# Patient Record
Sex: Male | Born: 1995 | Race: Black or African American | Hispanic: No | Smoking: Never smoker
Health system: Southern US, Community
[De-identification: ages and names within clinical notes are randomized; demographics above are authoritative.]

---
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
## 2017-09-01 LAB — GLUCOSE, POCT (MANUAL RESULT ENTRY): POC Glucose: 106 mg/dl — AB (ref 70–99)

## 2018-07-01 ENCOUNTER — Other Ambulatory Visit: Payer: Self-pay

## 2018-07-01 ENCOUNTER — Emergency Department (HOSPITAL_COMMUNITY)
Admission: EM | Admit: 2018-07-01 | Discharge: 2018-07-01 | Disposition: A | Discharge status: Home / Self Care | Payer: Self-pay | Attending: Emergency Medicine | Admitting: Emergency Medicine

## 2018-07-01 ENCOUNTER — Encounter (HOSPITAL_COMMUNITY): Payer: Self-pay | Admitting: *Deleted

## 2018-07-01 ENCOUNTER — Emergency Department (HOSPITAL_COMMUNITY): Payer: Self-pay

## 2018-07-01 DIAGNOSIS — J069 Acute upper respiratory infection, unspecified: Secondary | ICD-10-CM | POA: Insufficient documentation

## 2018-07-01 DIAGNOSIS — B9789 Other viral agents as the cause of diseases classified elsewhere: Secondary | ICD-10-CM

## 2018-07-01 NOTE — Discharge Instructions (Signed)
Your symptoms are likely caused by a viral upper respiratory infection. Antibiotics are not helpful in treating viral infection, the virus should run its course in about 5-7 days. Please make sure you are drinking plenty of fluids. You can treat your symptoms supportively with tylenol/ibuprofen for fevers and pains, Zyrtec and Flonase to help with nasal congestion, and over the counter cough syrups and throat lozenges to help with cough. If your symptoms are not improving please follow up with you Primary doctor.   If you develop persistent fevers, shortness of breath or difficulty breathing, chest pain, severe headache and neck pain, persistent nausea and vomiting or other new or concerning symptoms return to the Emergency department.  

## 2018-07-01 NOTE — ED Notes (Signed)
Patient transported to X-ray 

## 2018-07-01 NOTE — ED Provider Notes (Signed)
[facility] EMERGENCY DEPARTMENT Provider Note   CSN: [id number] Arrival date & time: 07/01/18  1748     History   Chief Complaint Chief Complaint  Patient presents with  . Cough  . URI    HPI Grant Blair is a 23 y.o. male.  Grant Blair is a 23 y.o. male who is otherwise healthy, presents to the emergency department for 4 days of fevers, cough, body aches, sore throat, rhinorrhea and nasal congestion.  Symptoms were gradual in onset and have been constant and worsening since then.  He is taken some over-the-counter Mucinex and NyQuil with mild improvement has not tried anything else to treat his symptoms.  He does report cough is intermittently productive and has had some fevers, T-max at home of 102 which she is treated with Tylenol.  He denies any chest pain or shortness of breath, no difficulty breathing.  No history of reactive airway disease or smoking.  He denies any associated nausea vomiting or abdominal pain, unsure of any specific sick contacts.  No other aggravating or alleviating factors.     History reviewed. No pertinent past medical history.  There are no active problems to display for this patient.   History reviewed. No pertinent surgical history.      Home Medications    Prior to Admission medications   Medication Sig Start Date End Date Taking? Authorizing Provider  benzonatate (TESSALON) 100 MG capsule Take 2 capsules (200 mg total) by mouth 2 (two) times daily as needed for cough. 01/14/15   Barrett Henle, PA-C    Family History History reviewed. No pertinent family history.  Social History Social History   Tobacco Use  . Smoking status: Never Smoker  Substance Use Topics  . Alcohol use: Never    Frequency: Never  . Drug use: Not on file     Allergies   Patient has no known allergies.   Review of Systems Review of Systems  Constitutional: Positive for chills and fever.  HENT: Positive for  congestion, postnasal drip, rhinorrhea and sore throat. Negative for ear discharge, ear pain and sinus pain.   Eyes: Negative for visual disturbance.  Respiratory: Positive for cough. Negative for chest tightness, shortness of breath and wheezing.   Cardiovascular: Negative for chest pain.  Gastrointestinal: Negative for abdominal pain, nausea and vomiting.  Genitourinary: Negative for dysuria and frequency.  Musculoskeletal: Negative for arthralgias and myalgias.  Skin: Negative for color change and rash.  Neurological: Negative for dizziness, syncope and headaches.     Physical Exam Updated Vital Signs BP 117/76 (BP Location: Right Arm)   Pulse 99   Temp 98.9 F (37.2 C) (Oral)   Resp 18   SpO2 98%   Physical Exam Vitals signs and nursing note reviewed.  Constitutional:      General: He is not in acute distress.    Appearance: Normal appearance. He is well-developed and normal weight. He is not ill-appearing or diaphoretic.  HENT:     Head: Normocephalic and atraumatic.     Right Ear: Tympanic membrane and ear canal normal.     Left Ear: Tympanic membrane and ear canal normal.     Nose: Congestion and rhinorrhea present.     Comments: Bilateral nares patent with moderate mucosal edema and clear rhinorrhea present.     Mouth/Throat:     Mouth: Mucous membranes are moist.     Pharynx: Oropharynx is clear. Posterior oropharyngeal erythema present.  Comments: Posterior oropharynx clear and mucous membranes moist, there is mild erythema but no edema or tonsillar exudates, uvula midline, normal phonation, no trismus, tolerating secretions without difficulty. Eyes:     General:        Right eye: No discharge.        Left eye: No discharge.  Neck:     Musculoskeletal: Neck supple.     Comments: No rigidity Cardiovascular:     Rate and Rhythm: Normal rate and regular rhythm.     Heart sounds: Normal heart sounds. No murmur. No friction rub. No gallop.   Pulmonary:      Effort: Pulmonary effort is normal. No respiratory distress.     Breath sounds: Normal breath sounds.     Comments: Respirations equal and unlabored, patient able to speak in full sentences, lungs clear to auscultation bilaterally Abdominal:     General: Bowel sounds are normal. There is no distension.     Palpations: Abdomen is soft. There is no mass.     Tenderness: There is no abdominal tenderness. There is no guarding.     Comments: Abdomen soft, nondistended, nontender to palpation in all quadrants without guarding or peritoneal signs  Musculoskeletal:        General: No deformity.  Lymphadenopathy:     Cervical: No cervical adenopathy.  Skin:    General: Skin is warm and dry.     Capillary Refill: Capillary refill takes less than 2 seconds.  Neurological:     Mental Status: He is alert and oriented to person, place, and time.  Psychiatric:        Mood and Affect: Mood normal.        Behavior: Behavior normal.      ED Treatments / Results  Labs (all labs ordered are listed, but only abnormal results are displayed) Labs Reviewed - No data to display  EKG None  Radiology Dg Chest 2 View  Result Date: 07/01/2018 CLINICAL DATA:  Cough, body aches, sore throat, and fever for 4 days. EXAM: CHEST - 2 VIEW COMPARISON:  01/14/2015 FINDINGS: The heart size and mediastinal contours are within normal limits. Both lungs are clear. The visualized skeletal structures are unremarkable. IMPRESSION: No active cardiopulmonary disease. Electronically Signed   By: Burman Nieves M.D.   On: 07/01/2018 20:54    Procedures Procedures (including critical care time)  Medications Ordered in ED Medications - No data to display   Initial Impression / Assessment and Plan / ED Course  I have reviewed the triage vital signs and the nursing notes.  Pertinent labs & imaging results that were available during my care of the patient were reviewed by me and considered in my medical decision making  (see chart for details).  Pt presents with nasal congestion and cough. Pt is well appearing and vitals are normal. Lungs CTA on exam. Pt CXR negative for acute infiltrate. Patients symptoms are consistent with URI, likely viral etiology. Discussed that antibiotics are not indicated for viral infections. Pt will be discharged with symptomatic treatment.  Verbalizes understanding and is agreeable with plan. Pt is hemodynamically stable & in NAD prior to dc. Return precautions discussed, pt expresses understanding and agrees with plan.   Final Clinical Impressions(s) / ED Diagnoses   Final diagnoses:  Viral URI with cough    ED Discharge Orders    None       Legrand Rams 07/01/18 2113    Maia Plan, MD 07/02/18 1334

## 2018-07-01 NOTE — ED Triage Notes (Signed)
Pt reports cough, bodyaches, sore throat, fever x 3-4 days.

## 2018-10-09 ENCOUNTER — Emergency Department (HOSPITAL_COMMUNITY)
Admission: EM | Admit: 2018-10-09 | Discharge: 2018-10-09 | Disposition: A | Discharge status: Home / Self Care | Payer: Medicaid Other | Attending: Emergency Medicine | Admitting: Emergency Medicine

## 2018-10-09 ENCOUNTER — Other Ambulatory Visit: Payer: Self-pay

## 2018-10-09 ENCOUNTER — Encounter (HOSPITAL_COMMUNITY): Payer: Self-pay

## 2018-10-09 DIAGNOSIS — Y999 Unspecified external cause status: Secondary | ICD-10-CM | POA: Diagnosis not present

## 2018-10-09 DIAGNOSIS — S39012A Strain of muscle, fascia and tendon of lower back, initial encounter: Secondary | ICD-10-CM | POA: Diagnosis not present

## 2018-10-09 DIAGNOSIS — Y939 Activity, unspecified: Secondary | ICD-10-CM | POA: Diagnosis not present

## 2018-10-09 DIAGNOSIS — Y92099 Unspecified place in other non-institutional residence as the place of occurrence of the external cause: Secondary | ICD-10-CM | POA: Diagnosis not present

## 2018-10-09 DIAGNOSIS — S3982XA Other specified injuries of lower back, initial encounter: Secondary | ICD-10-CM | POA: Diagnosis present

## 2018-10-09 DIAGNOSIS — X500XXA Overexertion from strenuous movement or load, initial encounter: Secondary | ICD-10-CM | POA: Diagnosis not present

## 2018-10-09 DIAGNOSIS — Z79899 Other long term (current) drug therapy: Secondary | ICD-10-CM | POA: Insufficient documentation

## 2018-10-09 MED ORDER — KETOROLAC TROMETHAMINE 30 MG/ML IJ SOLN
30.0000 mg | Freq: Once | INTRAMUSCULAR | Status: AC
  Administered 2018-10-09: 30 mg via INTRAMUSCULAR
  Filled 2018-10-09: qty 1

## 2018-10-09 MED ORDER — METHOCARBAMOL 500 MG PO TABS: 500.0000 mg | ORAL_TABLET | Freq: Two times a day (BID) | ORAL | 0 refills | Status: AC

## 2018-10-09 MED ORDER — NAPROXEN 500 MG PO TABS: 500.0000 mg | ORAL_TABLET | Freq: Two times a day (BID) | ORAL | 0 refills | Status: AC

## 2018-10-09 NOTE — Discharge Instructions (Addendum)
Take the medications to help with your pain. Applying heat, stretching and massaging the area may help as well. Return if you have worsening symptoms, trouble breathing, trouble walking, numbness in arms or legs, losing control of your bowels or bladder, injuries or falls.

## 2018-10-09 NOTE — ED Triage Notes (Signed)
Pt states he was helping his mother move furniture 2 days ago when he started having back pain in the middle of his back.

## 2018-10-09 NOTE — ED Provider Notes (Signed)
[facility]-EMERGENCY DEPT Provider Note   CSN: [id number] Arrival date & time: 10/09/18  1009    History   Chief Complaint Chief Complaint  Patient presents with  . Back Pain    HPI Grant Blair is a 23 y.o. male who presents to ED for 2 day history of aching lower back pain. States that the pain has been intermittent, only mildly improved with Tylenol. Worse with movement and palpation. Symptoms began after he was helping his mother move a heavy dresser in her house.  He denies any injuries or falls.  Denies any prior back surgeries, history of cancer, history of IV drug use, shortness of breath, loss of bowel or bladder function, fever, urinary symptoms.     HPI  History reviewed. No pertinent past medical history.  There are no active problems to display for this patient.   History reviewed. No pertinent surgical history.      Home Medications    Prior to Admission medications   Medication Sig Start Date End Date Taking? Authorizing Provider  benzonatate (TESSALON) 100 MG capsule Take 2 capsules (200 mg total) by mouth 2 (two) times daily as needed for cough. 01/14/15   Barrett Henle, PA-C  methocarbamol (ROBAXIN) 500 MG tablet Take 1 tablet (500 mg total) by mouth 2 (two) times daily. 10/09/18   [place], PA-C  naproxen (NAPROSYN) 500 MG tablet Take 1 tablet (500 mg total) by mouth 2 (two) times daily. 10/09/18   Dietrich Pates, PA-C    Family History No family history on file.  Social History Social History   Tobacco Use  . Smoking status: Never Smoker  . Smokeless tobacco: Never Used  Substance Use Topics  . Alcohol use: Never    Frequency: Never  . Drug use: Never     Allergies   Patient has no known allergies.   Review of Systems Review of Systems  Respiratory: Negative for shortness of breath.   Genitourinary: Negative for dysuria.  Musculoskeletal: Positive for back pain and myalgias. Negative for  arthralgias and neck pain.  Skin: Negative for wound.  Neurological: Negative for weakness, light-headedness and numbness.     Physical Exam Updated Vital Signs BP 131/73   Pulse 63   Temp 97.7 F (36.5 C) (Oral)   Resp 14   Ht 5\' 7"  (1.702 m)   Wt 77.1 kg   SpO2 99%   BMI 26.63 kg/m   Physical Exam Vitals signs and nursing note reviewed.  Constitutional:      General: He is not in acute distress.    Appearance: He is well-developed. He is not diaphoretic.  HENT:     Head: Normocephalic and atraumatic.  Eyes:     General: No scleral icterus.    Conjunctiva/sclera: Conjunctivae normal.  Neck:     Musculoskeletal: Normal range of motion.  Cardiovascular:     Rate and Rhythm: Normal rate and regular rhythm.  Pulmonary:     Effort: Pulmonary effort is normal. No respiratory distress.     Breath sounds: Normal breath sounds.  Abdominal:     Tenderness: There is no abdominal tenderness.  Musculoskeletal:       Back:     Comments: TTP of the indicated area of the lumbar spine and paraspinal musculature. No midline spinal tenderness present in thoracic or cervical spine. No step-off palpated. No visible bruising, edema or temperature change noted. No objective signs of numbness present. No saddle anesthesia. 2+ DP pulses bilaterally.  Sensation intact to light touch. Strength 5/5 in bilateral lower extremities. Ambulatory.  Skin:    Findings: No rash.  Neurological:     Mental Status: He is alert.      ED Treatments / Results  Labs (all labs ordered are listed, but only abnormal results are displayed) Labs Reviewed - No data to display  EKG None  Radiology No results found.  Procedures Procedures (including critical care time)  Medications Ordered in ED Medications  ketorolac (TORADOL) 30 MG/ML injection 30 mg (has no administration in time range)     Initial Impression / Assessment and Plan / ED Course  I have reviewed the triage vital signs and the  nursing notes.  Pertinent labs & imaging results that were available during my care of the patient were reviewed by me and considered in my medical decision making (see chart for details).       Suspect lumbar strain as the cause of his symptoms as pain is reproducible on exam, history of heavy lifting 2 days ago. Patient denies any concerning symptoms suggestive of cauda equina requiring urgent imaging at this time such as loss of sensation in the lower extremities, lower extremity weakness, loss of bowel or bladder control, saddle anesthesia, urinary retention, fever/chills, IVDU. Exam demonstrated no  weakness on exam today. No preceding injury or trauma to suggest acute fracture. Doubt pelvic or urinary pathology for patient's acute back pain, as patient denies urinary symptoms. Doubt AAA as cause of patient's back pain as patient lacks major risk factors, had no abdominal TTP, and has symmetric and intact distal pulses. Patient given strict return precautions for any symptoms indicating worsening neurologic function in the lower extremities. Will discharge with muscle relaxer, anti-inflammatories and return precautions.  Patient is hemodynamically stable, in NAD, and able to ambulate in the ED. Evaluation does not show pathology that would require ongoing emergent intervention or inpatient treatment. I explained the diagnosis to the patient. Pain has been managed and has no complaints prior to discharge. Patient is comfortable with above plan and is stable for discharge at this time. All questions were answered prior to disposition. Strict return precautions for returning to the ED were discussed. Encouraged follow up with PCP.   An After Visit Summary was printed and given to the patient.   Portions of this note were generated with Scientist, clinical (histocompatibility and immunogenetics)Dragon dictation software. Dictation errors may occur despite best attempts at proofreading.  Final Clinical Impressions(s) / ED Diagnoses   Final diagnoses:   Strain of lumbar region, initial encounter    ED Discharge Orders         Ordered    methocarbamol (ROBAXIN) 500 MG tablet  2 times daily     10/09/18 1030    naproxen (NAPROSYN) 500 MG tablet  2 times daily     10/09/18 [address]1030           Mossie Gilder, PA-C 10/09/18 1030    Mancel BaleWentz, [place], MD 10/09/18 1453

## 2018-11-20 ENCOUNTER — Emergency Department (HOSPITAL_COMMUNITY): Payer: No Typology Code available for payment source

## 2018-11-20 ENCOUNTER — Other Ambulatory Visit: Payer: Self-pay

## 2018-11-20 ENCOUNTER — Emergency Department (HOSPITAL_COMMUNITY)
Admission: EM | Admit: 2018-11-20 | Discharge: 2018-11-20 | Disposition: A | Discharge status: Home / Self Care | Payer: No Typology Code available for payment source | Attending: Emergency Medicine | Admitting: Emergency Medicine

## 2018-11-20 ENCOUNTER — Encounter (HOSPITAL_COMMUNITY): Payer: Self-pay

## 2018-11-20 DIAGNOSIS — S39012D Strain of muscle, fascia and tendon of lower back, subsequent encounter: Secondary | ICD-10-CM | POA: Insufficient documentation

## 2018-11-20 DIAGNOSIS — Y999 Unspecified external cause status: Secondary | ICD-10-CM | POA: Diagnosis not present

## 2018-11-20 DIAGNOSIS — T148XXA Other injury of unspecified body region, initial encounter: Secondary | ICD-10-CM

## 2018-11-20 DIAGNOSIS — Y929 Unspecified place or not applicable: Secondary | ICD-10-CM | POA: Diagnosis not present

## 2018-11-20 DIAGNOSIS — Y939 Activity, unspecified: Secondary | ICD-10-CM | POA: Insufficient documentation

## 2018-11-20 NOTE — ED Notes (Signed)
Bed: WTR8 Expected date:  Expected time:  Means of arrival:  Comments: 
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
## 2019-03-09 ENCOUNTER — Other Ambulatory Visit: Payer: Self-pay

## 2019-03-09 DIAGNOSIS — Z79899 Other long term (current) drug therapy: Secondary | ICD-10-CM | POA: Insufficient documentation

## 2019-03-09 DIAGNOSIS — H5711 Ocular pain, right eye: Secondary | ICD-10-CM | POA: Diagnosis present

## 2019-03-09 DIAGNOSIS — H1131 Conjunctival hemorrhage, right eye: Secondary | ICD-10-CM | POA: Diagnosis not present

## 2019-03-09 DIAGNOSIS — R519 Headache, unspecified: Secondary | ICD-10-CM | POA: Insufficient documentation
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
## 2019-03-10 ENCOUNTER — Emergency Department (HOSPITAL_COMMUNITY)
Admission: EM | Admit: 2019-03-10 | Discharge: 2019-03-10 | Disposition: A | Discharge status: Home / Self Care | Payer: Medicaid Other | Attending: Emergency Medicine | Admitting: Emergency Medicine

## 2019-03-10 ENCOUNTER — Emergency Department (HOSPITAL_COMMUNITY): Payer: Medicaid Other

## 2019-03-10 DIAGNOSIS — H1131 Conjunctival hemorrhage, right eye: Secondary | ICD-10-CM

## 2019-03-10 NOTE — Discharge Instructions (Addendum)
Can continue using ice on the face to help with swelling. Can take tylenol or motrin for pain. Subconjunctival hemorrhage will resolve on its own, nothing to do about this and should not affect your vision. Return here for any new/acute changes.
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
## 2019-03-13 ENCOUNTER — Other Ambulatory Visit: Payer: Self-pay

## 2019-03-13 DIAGNOSIS — Z20822 Contact with and (suspected) exposure to covid-19: Secondary | ICD-10-CM

## 2019-03-15 LAB — NOVEL CORONAVIRUS, NAA: SARS-CoV-2, NAA: NOT DETECTED

## 2019-07-09 ENCOUNTER — Emergency Department (HOSPITAL_COMMUNITY)
Admission: EM | Admit: 2019-07-09 | Discharge: 2019-07-09 | Disposition: A | Discharge status: Home / Self Care | Payer: Medicaid Other | Attending: Emergency Medicine | Admitting: Emergency Medicine

## 2019-07-09 ENCOUNTER — Other Ambulatory Visit: Payer: Self-pay

## 2019-07-09 ENCOUNTER — Encounter (HOSPITAL_COMMUNITY): Payer: Self-pay | Admitting: Family Medicine

## 2019-07-09 DIAGNOSIS — R369 Urethral discharge, unspecified: Secondary | ICD-10-CM

## 2019-07-09 DIAGNOSIS — N390 Urinary tract infection, site not specified: Secondary | ICD-10-CM | POA: Insufficient documentation

## 2019-07-09 DIAGNOSIS — Z202 Contact with and (suspected) exposure to infections with a predominantly sexual mode of transmission: Secondary | ICD-10-CM | POA: Insufficient documentation

## 2019-07-09 DIAGNOSIS — Z79899 Other long term (current) drug therapy: Secondary | ICD-10-CM | POA: Diagnosis not present

## 2019-07-09 LAB — URINALYSIS, COMPLETE (UACMP) WITH MICROSCOPIC
Bacteria, UA: NONE SEEN
Bilirubin Urine: NEGATIVE
Glucose, UA: NEGATIVE mg/dL
Hgb urine dipstick: NEGATIVE
Ketones, ur: 5 mg/dL — AB
Nitrite: NEGATIVE
Protein, ur: NEGATIVE mg/dL
Specific Gravity, Urine: 1.029 (ref 1.005–1.030)
WBC, UA: >50 WBC/hpf — ABNORMAL HIGH (ref 0–5)
pH: 7 (ref 5.0–8.0)

## 2019-07-09 LAB — HIV ANTIBODY (ROUTINE TESTING W REFLEX): HIV Screen 4th Generation wRfx: NONREACTIVE

## 2019-07-09 MED ORDER — CEFTRIAXONE SODIUM 1 G IJ SOLR
500.0000 mg | INTRAMUSCULAR | Status: DC
  Administered 2019-07-09: 20:00:00 500 mg via INTRAMUSCULAR
  Filled 2019-07-09: qty 10

## 2019-07-09 MED ORDER — DOXYCYCLINE HYCLATE 100 MG PO CAPS: 100.0000 mg | ORAL_CAPSULE | Freq: Two times a day (BID) | ORAL | 0 refills | Status: AC

## 2019-07-09 MED ORDER — STERILE WATER FOR INJECTION IJ SOLN
INTRAMUSCULAR | Status: AC
  Administered 2019-07-09: 20:00:00 2.1 mL
  Filled 2019-07-09: qty 10

## 2019-07-09 MED ORDER — DOXYCYCLINE HYCLATE 100 MG PO TABS
100.0000 mg | ORAL_TABLET | Freq: Once | ORAL | Status: AC
  Administered 2019-07-09: 20:00:00 100 mg via ORAL
  Filled 2019-07-09: qty 1

## 2019-07-09 MED ORDER — CEPHALEXIN 500 MG PO CAPS: 500.0000 mg | ORAL_CAPSULE | Freq: Four times a day (QID) | ORAL | 0 refills | Status: AC
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
## 2019-07-09 NOTE — Discharge Instructions (Addendum)
Urine sample showed a urinary tract infection.  Please take antibiotic as prescribed.  This will cover a possible sexually transmitted disease as well as the UTI.  Recommend following up with your primary doctor.  If you develop abdominal pain, fever, other new concerning symptom recommend return to ER for reassessment.

## 2019-07-09 NOTE — ED Triage Notes (Addendum)
x

## 2019-07-10 LAB — RPR: RPR Ser Ql: NONREACTIVE

## 2019-07-11 LAB — GC/CHLAMYDIA PROBE AMP (~~LOC~~) NOT AT ARMC
Chlamydia: NEGATIVE
Neisseria Gonorrhea: POSITIVE — AB
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
## 2021-10-02 ENCOUNTER — Emergency Department (HOSPITAL_COMMUNITY)
Admission: EM | Admit: 2021-10-02 | Discharge: 2021-10-02 | Disposition: A | Discharge status: Home / Self Care | Payer: Medicaid Other | Attending: Emergency Medicine | Admitting: Emergency Medicine

## 2021-10-02 ENCOUNTER — Other Ambulatory Visit: Payer: Self-pay

## 2021-10-02 DIAGNOSIS — H02844 Edema of left upper eyelid: Secondary | ICD-10-CM | POA: Diagnosis not present

## 2021-10-02 DIAGNOSIS — S0502XA Injury of conjunctiva and corneal abrasion without foreign body, left eye, initial encounter: Secondary | ICD-10-CM | POA: Insufficient documentation

## 2021-10-02 DIAGNOSIS — H02845 Edema of left lower eyelid: Secondary | ICD-10-CM | POA: Diagnosis not present

## 2021-10-02 DIAGNOSIS — Y92832 Beach as the place of occurrence of the external cause: Secondary | ICD-10-CM | POA: Insufficient documentation

## 2021-10-02 DIAGNOSIS — X58XXXA Exposure to other specified factors, initial encounter: Secondary | ICD-10-CM | POA: Insufficient documentation

## 2021-10-02 DIAGNOSIS — H5789 Other specified disorders of eye and adnexa: Secondary | ICD-10-CM | POA: Diagnosis present

## 2021-10-02 MED ORDER — ERYTHROMYCIN 5 MG/GM OP OINT: TOPICAL_OINTMENT | OPHTHALMIC | 0 refills | Status: AC

## 2021-10-02 MED ORDER — FLUORESCEIN SODIUM 1 MG OP STRP
1.0000 | ORAL_STRIP | Freq: Once | OPHTHALMIC | Status: AC
  Administered 2021-10-02: 1 via OPHTHALMIC
  Filled 2021-10-02: qty 1

## 2021-10-02 MED ORDER — TETRACAINE HCL 0.5 % OP SOLN
2.0000 [drp] | Freq: Once | OPHTHALMIC | Status: AC
  Administered 2021-10-02: 2 [drp] via OPHTHALMIC
  Filled 2021-10-02: qty 4

## 2021-10-02 MED ORDER — ERYTHROMYCIN 5 MG/GM OP OINT
TOPICAL_OINTMENT | Freq: Once | OPHTHALMIC | Status: AC
  Administered 2021-10-02: 1 via OPHTHALMIC
  Filled 2021-10-02: qty 3.5

## 2021-10-02 NOTE — ED Notes (Signed)
Visual Acuity findings are as noted. Left eye at 38ft- 20/15 Right eye at 20 ft- 20/13  Both eyes at 20 ft- 20/20 ?

## 2021-10-02 NOTE — ED Triage Notes (Signed)
Pt states his left eye started hurting this morning. Reports swelling. Denies any injury ?

## 2021-10-02 NOTE — ED Provider Notes (Signed)
?[facility]-EMERGENCY DEPT ?Provider Note ? ? ?CSN: [id number] ?Arrival date & time: 10/02/21  1802 ? ?  ? ?History ? ?Chief Complaint  ?Patient presents with  ? Eye Problem  ? ? ?Grant Blair is a 26 y.o. male. ? ?HPI ?Patient is a 26 year old male who presents to the emergency department due to left eye irritation.  [place] the beach [place] and woke up this morning with redness in the left eye, increased lacrimation, as well as mild edema on the eyelids.  Denies visual changes, rhinorrhea, sore throat, cough, photophobia.  Denies any recent sick contacts. ?  ? ?Home Medications ?Prior to Admission medications   ?Medication Sig Start Date End Date Taking? Authorizing Provider  ?erythromycin ophthalmic ointment Place a 1/2 inch ribbon of ointment into the lower eyelid four times per day for five days. 10/02/21  Yes Placido Sou, PA-C  ?benzonatate (TESSALON) 100 MG capsule Take 2 capsules (200 mg total) by mouth 2 (two) times daily as needed for cough. 01/14/15   Barrett Henle, PA-C  ?methocarbamol (ROBAXIN) 500 MG tablet Take 1 tablet (500 mg total) by mouth 2 (two) times daily. 10/09/18   Khatri, Hina, PA-C  ?naproxen (NAPROSYN) 500 MG tablet Take 1 tablet (500 mg total) by mouth 2 (two) times daily. 10/09/18   Dietrich Pates, PA-C  ?   ? ?Allergies    ?Patient has no known allergies.   ? ?Review of Systems   ?Review of Systems  ?Constitutional:  Negative for fever.  ?HENT:  Negative for congestion and sore throat.   ?Eyes:  Positive for pain, discharge, redness and itching. Negative for photophobia and visual disturbance.  ?Respiratory:  Negative for cough.   ? ?Physical Exam ?Updated Vital Signs ?BP 109/70   Pulse 71   Temp 98.5 ?F (36.9 ?C) (Oral)   Resp 18   Ht 5\' 8"  (1.727 m)   Wt 65.8 kg   SpO2 98%   BMI 22.05 kg/m?  ?Physical Exam ?Vitals and nursing note reviewed.  ?Constitutional:   ?   General: He is not in acute distress. ?   Appearance: He is  well-developed.  ?HENT:  ?   Head: Normocephalic and atraumatic.  ?   Right Ear: External ear normal.  ?   Left Ear: External ear normal.  ?Eyes:  ?   General: No scleral icterus.    ?   Right eye: No discharge.     ?   Left eye: No discharge.  ?   Extraocular Movements: Extraocular movements intact.  ?   Pupils: Pupils are equal, round, and reactive to light.  ?   Comments: Pupils are equal, round, and reactive to light.  Extraocular movements are intact.  Left eye is injected with increased lacrimation.  No purulent discharge noted.  Mild swelling noted along the eyelids.  Left eye stained with fluorescein showing a region of increased uptake along the 4 o'clock position of the cornea consistent with corneal abrasion.  Negative Seidel sign. ? ?Left eye at 50ft- 20/15 Right eye at 20 ft- 20/13  Both eyes at 20 ft- 20/20  ?Neck:  ?   Trachea: No tracheal deviation.  ?Cardiovascular:  ?   Rate and Rhythm: Normal rate.  ?Pulmonary:  ?   Effort: Pulmonary effort is normal. No respiratory distress.  ?   Breath sounds: No stridor.  ?Abdominal:  ?   General: There is no distension.  ?Musculoskeletal:     ?  General: No swelling or deformity.  ?   Cervical back: Neck supple.  ?Skin: ?   General: Skin is warm and dry.  ?   Findings: No rash.  ?Neurological:  ?   Mental Status: He is alert.  ?   Cranial Nerves: Cranial nerve deficit: no gross deficits.  ? ?ED Results / Procedures / Treatments   ?Labs ?(all labs ordered are listed, but only abnormal results are displayed) ?Labs Reviewed - No data to display ? ?EKG ?None ? ?Radiology ?No results found. ? ?Procedures ?Procedures  ? ?Medications Ordered in ED ?Medications  ?fluorescein ophthalmic strip 1 strip (has no administration in time range)  ?tetracaine (PONTOCAINE) 0.5 % ophthalmic solution 2 drop (has no administration in time range)  ?erythromycin ophthalmic ointment (has no administration in time range)  ? ?ED Course/ Medical Decision Making/ A&P ?  ?                         ?Medical Decision Making ?Risk ?Prescription drug management. ? ?Pt is a 26 y.o. male who presents to the emergency department with left eye irritation and swelling that began earlier this morning upon waking. ? ?On my exam patient's left sclera is injected.  Increased lacrimation.  Mild edema noted along the upper and lower eyelid.  No purulent drainage noted.  No pain with extraocular movements.  No photophobia.  Visual acuity appears reassuring.  I performed fluorescein staining of the left eye which showed a region of increased uptake at the 4 o'clock position of the cornea consistent with corneal abrasion.  No visible foreign bodies noted. ? ?He denies wearing contact lenses.  We will start patient on a course of erythromycin.  First dose given in the ED.  Discussed return precautions at length.  He appears stable for discharge at this time and he is agreeable.  His questions were answered and he was amicable at the time of discharge. ? ?Note: Portions of this report may have been transcribed using voice recognition software. Every effort was made to ensure accuracy; however, inadvertent computerized transcription errors may be present.  ? ?Final Clinical Impression(s) / ED Diagnoses ?Final diagnoses:  ?Abrasion of left cornea, initial encounter  ? ? ?Rx / DC Orders ?ED Discharge Orders   ? ?      Ordered  ?  erythromycin ophthalmic ointment       ? 10/02/21 2048  ? ?  ?  ? ?  ? ? ?  ?Placido Sou, PA-C ?10/02/21 2121 ? ?  ?Bethann Berkshire, MD ?10/05/21 1053 ? ?

## 2021-10-02 NOTE — Discharge Instructions (Addendum)
I am prescribing you an antibiotic called erythromycin.  You can continue to use the bottle that you were given in the emergency department and I have given you a prescription for an additional one if you run out.  You need to place this in the left eye 4 times per day for the next 5 days.  Do not stop using this early.   ? ?Please follow-up with an eye doctor regarding your symptoms as well as this visit today.  If you develop any new or worsening symptoms please come back to the emergency department. ?

## 2022-01-11 ENCOUNTER — Other Ambulatory Visit: Payer: Self-pay

## 2022-01-11 ENCOUNTER — Encounter (HOSPITAL_COMMUNITY): Payer: Self-pay

## 2022-01-11 ENCOUNTER — Emergency Department (HOSPITAL_COMMUNITY)
Admission: EM | Admit: 2022-01-11 | Discharge: 2022-01-11 | Disposition: A | Discharge status: Home / Self Care | Payer: Medicaid Other | Attending: Emergency Medicine | Admitting: Emergency Medicine

## 2022-01-11 DIAGNOSIS — Z202 Contact with and (suspected) exposure to infections with a predominantly sexual mode of transmission: Secondary | ICD-10-CM | POA: Insufficient documentation

## 2022-01-11 LAB — URINALYSIS, ROUTINE W REFLEX MICROSCOPIC
Bilirubin Urine: NEGATIVE
Glucose, UA: NEGATIVE mg/dL
Hgb urine dipstick: NEGATIVE
Ketones, ur: NEGATIVE mg/dL
Leukocytes,Ua: NEGATIVE
Nitrite: NEGATIVE
Protein, ur: NEGATIVE mg/dL
Specific Gravity, Urine: 1.027 (ref 1.005–1.030)
pH: 6 (ref 5.0–8.0)

## 2022-01-11 LAB — HIV ANTIBODY (ROUTINE TESTING W REFLEX): HIV Screen 4th Generation wRfx: NONREACTIVE

## 2022-01-11 NOTE — ED Provider Notes (Signed)
[facility]-EMERGENCY DEPT Provider Note   CSN: [id number] Arrival date & time: 01/11/22  1613     History  Chief Complaint  Patient presents with   Exposure to STD    Grant Blair is a 27 y.o. male.   Exposure to STD    26 year old male presents emergency department with complaints of STI exposure.  Patient states that he had unprotected vaginal intercourse approximately 2 weeks ago.  He is currently endorsing no symptoms but is concerned about potential STI exposure.  He is requesting to be tested for everything.  Denies dysuria, discharge, hematuria, rash, fever, abdominal pain, nausea, vomiting.  No significant past medical history  Home Medications Prior to Admission medications   Medication Sig Start Date End Date Taking? Authorizing Provider  benzonatate (TESSALON) 100 MG capsule Take 2 capsules (200 mg total) by mouth 2 (two) times daily as needed for cough. 01/14/15   Barrett Henle, PA-C  erythromycin ophthalmic ointment Place a 1/2 inch ribbon of ointment into the lower eyelid four times per day for five days. 10/02/21   Placido Sou, PA-C  methocarbamol (ROBAXIN) 500 MG tablet Take 1 tablet (500 mg total) by mouth 2 (two) times daily. 10/09/18   Khatri, Hina, PA-C  naproxen (NAPROSYN) 500 MG tablet Take 1 tablet (500 mg total) by mouth 2 (two) times daily. 10/09/18   Dietrich Pates, PA-C      Allergies    Patient has no known allergies.    Review of Systems   Review of Systems  All other systems reviewed and are negative.   Physical Exam Updated Vital Signs BP 110/70 (BP Location: Right Arm)   Pulse (!) 51   Temp 98.9 F (37.2 C) (Oral)   Resp 18   Ht 5\' 7"  (1.702 m)   Wt 68 kg   SpO2 98%   BMI 23.49 kg/m  Physical Exam Vitals and nursing note reviewed.  Constitutional:      General: He is not in acute distress.    Appearance: He is well-developed.  HENT:     Head: Normocephalic and atraumatic.  Eyes:      Conjunctiva/sclera: Conjunctivae normal.  Cardiovascular:     Rate and Rhythm: Normal rate and regular rhythm.     Heart sounds: No murmur heard. Pulmonary:     Effort: Pulmonary effort is normal. No respiratory distress.     Breath sounds: Normal breath sounds.  Abdominal:     Palpations: Abdomen is soft.     Tenderness: There is no abdominal tenderness.  Genitourinary:    Comments: Patient declined GU exam at this time. Musculoskeletal:        General: No swelling.     Cervical back: Neck supple.  Skin:    General: Skin is warm and dry.     Capillary Refill: Capillary refill takes less than 2 seconds.  Neurological:     Mental Status: He is alert.  Psychiatric:        Mood and Affect: Mood normal.     ED Results / Procedures / Treatments   Labs (all labs ordered are listed, but only abnormal results are displayed) Labs Reviewed  URINALYSIS, ROUTINE W REFLEX MICROSCOPIC  RPR  HIV ANTIBODY (ROUTINE TESTING W REFLEX)  GC/CHLAMYDIA PROBE AMP ([place]) NOT AT [facility]    EKG None  Radiology No results found.  Procedures Procedures    Medications Ordered in ED Medications - No data to display  ED Course/ Medical Decision  Making/ A&P                           Medical Decision Making Amount and/or Complexity of Data Reviewed Labs: ordered.   This patient presents to the ED for concern of STI exposure, this involves an extensive number of treatment options, and is a complaint that carries with it a high risk of complications and morbidity.  The differential diagnosis includes gonorrhea, chlamydia, syphilis, HIV, HSV   Co morbidities that complicate the patient evaluation  See HPI   Additional history obtained:  Additional history obtained from EMR  Lab Tests:  I Ordered, and personally interpreted labs.  The pertinent results include: RPR, HIV, GC/chlamydia which are pending upon discharge.  Urinalysis significant for no abnormalities   Imaging  Studies ordered:  N/a   Cardiac Monitoring: / EKG:  The patient was maintained on a cardiac monitor.  I personally viewed and interpreted the cardiac monitored which showed an underlying rhythm of: Sinus rhythm   Consultations Obtained:  N/a   Problem List / ED Course / Critical interventions / Medication management  Reevaluation of the patient showed that the patient improved I have reviewed the patients home medicines and have made adjustments as needed   Social Determinants of Health:  Denies illicit drug use, alcohol use.   Test / Admission - Considered:  STD exposure Vitals signs within normal range and stable throughout visit. Laboratory/imaging studies significant for: See above Given the patient had no symptoms and urinalysis was negative for any acute abnormalities, patient deemed able to be discharged.  Close follow-up with MyChart recommended for STI results.  Follow-up with primary care, urgent care, ED recommended if any results are positive for appropriate treatment.  No antibiotic therapy given at this time.  Treat plan discussed with length with patient and he acknowledged understanding was agreeable to said plan. Worrisome signs and symptoms were discussed with the patient, and the patient acknowledged understanding to return to the ED if noticed. Patient was stable upon discharge.          Final Clinical Impression(s) / ED Diagnoses Final diagnoses:  STD exposure    Rx / DC Orders ED Discharge Orders     None         Peter Garter, [place] 01/11/22 1826    Arturo Morton K, DO 01/11/22 2304

## 2022-01-11 NOTE — ED Provider Triage Note (Signed)
Emergency Medicine Provider Triage Evaluation Note  Grant Blair , a 26 y.o. male  was evaluated in triage.  Pt complains of STI exposure.  Patient states that he had unprotected oral course approximately 2 weeks ago.  He is currently endorsing no symptoms but is concerned about potential STI exposure.  He is requesting to be tested for everything..  Review of Systems  Positive: See above Negative:   Physical Exam  BP 110/70 (BP Location: Right Arm)   Pulse (!) 51   Temp 98.9 F (37.2 C) (Oral)   Resp 18   Ht 5\' 7"  (1.702 m)   Wt 68 kg   SpO2 98%   BMI 23.49 kg/m  Gen:   Awake, no distress   Resp:  Normal effort  MSK:   Moves extremities without difficulty  Other:    Medical Decision Making  Medically screening exam initiated at 4:33 PM.  Appropriate orders placed.  Grant Blair was informed that the remainder of the evaluation will be completed by another provider, this initial triage assessment does not replace that evaluation, and the importance of remaining in the ED until their evaluation is complete.     Grant Blair, Grant Blair 01/11/22 [id number]

## 2022-01-11 NOTE — ED Triage Notes (Signed)
Pt states he is here for STD check. Denies any symptoms. Reports concern for possible exposure.

## 2022-01-11 NOTE — Discharge Instructions (Addendum)
Note that your urine today was without signs of urinary tract infection.  All of your STD/STI tests are still pending at this time.  Take a long time to result so follow MyChart for your results.  Follow-up with urgent care, family medicine or the emergency department if any of the test result positive for appropriate antibiotic therapy.  Please do not hesitate to return to the emergency department for worrisome signs and symptoms we discussed become apparent.

## 2022-01-12 LAB — GC/CHLAMYDIA PROBE AMP (~~LOC~~) NOT AT ARMC
Chlamydia: NEGATIVE
Comment: NEGATIVE
Comment: NORMAL
Neisseria Gonorrhea: NEGATIVE

## 2022-01-12 LAB — RPR: RPR Ser Ql: NONREACTIVE

## 2022-04-13 ENCOUNTER — Emergency Department (HOSPITAL_COMMUNITY): Payer: Medicaid Other

## 2022-04-13 ENCOUNTER — Emergency Department (HOSPITAL_COMMUNITY)
Admission: EM | Admit: 2022-04-13 | Discharge: 2022-04-14 | Discharge status: Against Medical Advice | Payer: Medicaid Other | Attending: Emergency Medicine | Admitting: Emergency Medicine

## 2022-04-13 DIAGNOSIS — Y92410 Unspecified street and highway as the place of occurrence of the external cause: Secondary | ICD-10-CM | POA: Diagnosis not present

## 2022-04-13 DIAGNOSIS — Z5321 Procedure and treatment not carried out due to patient leaving prior to being seen by health care provider: Secondary | ICD-10-CM | POA: Diagnosis not present

## 2022-04-13 DIAGNOSIS — M25562 Pain in left knee: Secondary | ICD-10-CM | POA: Diagnosis not present
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
## 2022-04-14 NOTE — ED Notes (Addendum)
Pt was seen by the NT leaving the ED with family. Unsure if they plan to return. Pt called x2 for repeat vitals with no answer.

## 2022-04-14 NOTE — ED Notes (Signed)
Attempted to contact pt by phone number in the chart, call going straight to voicemail, unable to leave message

## 2023-12-28 ENCOUNTER — Other Ambulatory Visit: Payer: Self-pay

## 2023-12-28 ENCOUNTER — Emergency Department (HOSPITAL_COMMUNITY): Admission: EM | Admit: 2023-12-28 | Discharge: 2023-12-28 | Disposition: A | Discharge status: Home / Self Care

## 2023-12-28 ENCOUNTER — Encounter (HOSPITAL_COMMUNITY): Payer: Self-pay | Admitting: Emergency Medicine

## 2023-12-28 DIAGNOSIS — Z113 Encounter for screening for infections with a predominantly sexual mode of transmission: Secondary | ICD-10-CM | POA: Insufficient documentation

## 2023-12-28 LAB — URINALYSIS, ROUTINE W REFLEX MICROSCOPIC
Bilirubin Urine: NEGATIVE
Glucose, UA: NEGATIVE mg/dL
Hgb urine dipstick: NEGATIVE
Ketones, ur: NEGATIVE mg/dL
Leukocytes,Ua: NEGATIVE
Nitrite: NEGATIVE
Protein, ur: NEGATIVE mg/dL
Specific Gravity, Urine: 1.021 (ref 1.005–1.030)
pH: 7 (ref 5.0–8.0)

## 2023-12-28 LAB — HIV ANTIBODY (ROUTINE TESTING W REFLEX): HIV Screen 4th Generation wRfx: NONREACTIVE

## 2023-12-28 NOTE — ED Triage Notes (Signed)
 Patient wants to be check for STD. Patient denies penile discharge, Itching or dysuria. Patient denies n/v and fever.

## 2023-12-28 NOTE — Discharge Instructions (Signed)
 You were seen in the emergency department today for concerns of STI evaluation.  You are currently being tested for gonorrhea, chlamydia, syphilis, and HIV.  Given lack of symptoms or exposure, we will hold off on starting treatment until results come back.  No results will be available on MyChart.  If you are positive for any of these test, you will receive a phone call regarding next steps in terms of treatment for further testing.  Return to the emergency department for any concerns of new or worsening symptoms or development of STI related symptoms such as burning or discomfort with urination, abnormal discharge, or testicular pain.

## 2023-12-28 NOTE — ED Provider Notes (Signed)
 [place] EMERGENCY DEPARTMENT AT [facility] Provider Note   CSN: [id number] Arrival date & time: 12/28/23  1635     Patient presents with: STD Check   Grant Blair is a 28 y.o. male.  Patient without significant medical history presents to the emergency department for STI evaluation.  He reports that he is here seeking routine STI screening.  Denies any symptoms.  No reported concerns for STI exposure.  Denies any changes with recent sexual partners.  No reported urethral discharge, burning, itching, or testicular discomfort or swelling.  HPI     Prior to Admission medications   Medication Sig Start Date End Date Taking? Authorizing Provider  benzonatate  (TESSALON ) 100 MG capsule Take 2 capsules (200 mg total) by mouth 2 (two) times daily as needed for cough. 01/14/15   Nevelyn Nat Norris, PA-C  erythromycin  ophthalmic ointment Place a 1/2 inch ribbon of ointment into the lower eyelid four times per day for five days. 10/02/21   Joldersma, Logan, PA-C  methocarbamol  (ROBAXIN ) 500 MG tablet Take 1 tablet (500 mg total) by mouth 2 (two) times daily. 10/09/18   Khatri, Hina, PA-C  naproxen  (NAPROSYN ) 500 MG tablet Take 1 tablet (500 mg total) by mouth 2 (two) times daily. 10/09/18   Khatri, Hina, PA-C    Allergies: Patient has no known allergies.    Review of Systems  Genitourinary:        STI evaluation  All other systems reviewed and are negative.   Updated Vital Signs BP 117/83   Pulse 89   Temp 98 F (36.7 C)   Resp 16   SpO2 100%   Physical Exam Vitals and nursing note reviewed.  Constitutional:      General: He is not in acute distress.    Appearance: He is well-developed.  HENT:     Head: Normocephalic and atraumatic.  Eyes:     Conjunctiva/sclera: Conjunctivae normal.  Cardiovascular:     Rate and Rhythm: Normal rate and regular rhythm.     Heart sounds: No murmur heard. Pulmonary:     Effort: Pulmonary effort is normal. No respiratory  distress.     Breath sounds: Normal breath sounds.  Abdominal:     Palpations: Abdomen is soft.     Tenderness: There is no abdominal tenderness.  Musculoskeletal:        General: No swelling.     Cervical back: Neck supple.  Skin:    General: Skin is warm and dry.     Capillary Refill: Capillary refill takes less than 2 seconds.  Neurological:     Mental Status: He is alert.  Psychiatric:        Mood and Affect: Mood normal.     (all labs ordered are listed, but only abnormal results are displayed) Labs Reviewed  URINALYSIS, ROUTINE W REFLEX MICROSCOPIC  HIV ANTIBODY (ROUTINE TESTING W REFLEX)  RPR  GC/CHLAMYDIA PROBE AMP ([place]) NOT AT [facility]    EKG: None  Radiology: No results found.   Procedures   Medications Ordered in the ED - No data to display                                  Medical Decision Making Amount and/or Complexity of Data Reviewed Labs: ordered.   This patient presents to the ED for concern of STI concerns.  Differential diagnosis includes gonorrhea, syphilis, chlamydia, HIV, UTI  Lab Tests:  I Ordered, and personally interpreted labs.  The pertinent results include: UA without signs of infection, RPR collected and pending, GC/chlamydia collected and pending, HIV collected and pending   Problem List / ED Course:  Patient without significant history presents to the emergency department for STI evaluation.  Reports that he is here for routine screening and denies any acute symptoms.  Denies dysuria, hematuria, abnormal discharge, testicular pain or swelling, or painful ejaculation. Physical exam is unremarkable.  GU exam deferred given lack of symptoms. Patient currently being screened with UA, GC/chlamydia, HIV, and RPR. UA negative for UTI.  Advised patient that all other results for STI testing such as gonorrhea, chlamydia, HIV, and syphilis will take several days to complete.  Results be available through MyChart.  Return precautions  discussed.  In the absence of symptoms, hold off on prophylactic treatment.  Otherwise stable for outpatient follow-up and discharged home.  Final diagnoses:  Screening examination for STI    ED Discharge Orders     None          Cecily Legrand LABOR, PA-C 12/28/23 1730    Neysa Caron PARAS, DO 12/28/23 2324

## 2023-12-29 LAB — RPR: RPR Ser Ql: NONREACTIVE

## 2023-12-31 LAB — GC/CHLAMYDIA PROBE AMP (~~LOC~~) NOT AT ARMC
Chlamydia: NEGATIVE
Comment: NEGATIVE
Comment: NORMAL
Neisseria Gonorrhea: NEGATIVE

## 2024-01-28 ENCOUNTER — Other Ambulatory Visit: Payer: Self-pay

## 2024-01-28 ENCOUNTER — Encounter: Payer: Self-pay | Admitting: *Deleted

## 2024-01-28 ENCOUNTER — Ambulatory Visit
Admission: EM | Admit: 2024-01-28 | Discharge: 2024-01-28 | Disposition: A | Discharge status: Home / Self Care | Attending: Physician Assistant | Admitting: Physician Assistant

## 2024-01-28 DIAGNOSIS — Z113 Encounter for screening for infections with a predominantly sexual mode of transmission: Secondary | ICD-10-CM | POA: Insufficient documentation

## 2024-01-28 NOTE — ED Provider Notes (Signed)
 [facility]    CSN: [id number] Arrival date & time: 01/28/24  9170      History   Chief Complaint Chief Complaint  Patient presents with   Follow-up    HPI Grant Blair is a 28 y.o. male.   Patient here today for STD screening.  He states he has a Ecologist of results through MyChart.  He initially reports that these are home health however these are not available in his chart.  His screenshot does not provide patient information or date of testing.  He denies any symptoms.  The history is provided by the patient.    History reviewed. No pertinent past medical history.  There are no active problems to display for this patient.   History reviewed. No pertinent surgical history.     Home Medications    Prior to Admission medications   Medication Sig Start Date End Date Taking? Authorizing Provider  benzonatate  (TESSALON ) 100 MG capsule Take 2 capsules (200 mg total) by mouth 2 (two) times daily as needed for cough. Patient not taking: Reported on 01/28/2024 01/14/15   Nevelyn Nat Norris, PA-C  erythromycin  ophthalmic ointment Place a 1/2 inch ribbon of ointment into the lower eyelid four times per day for five days. Patient not taking: Reported on 01/28/2024 10/02/21   Joldersma, Logan, PA-C  methocarbamol  (ROBAXIN ) 500 MG tablet Take 1 tablet (500 mg total) by mouth 2 (two) times daily. Patient not taking: Reported on 01/28/2024 10/09/18   Leotis Sole, PA-C  naproxen  (NAPROSYN ) 500 MG tablet Take 1 tablet (500 mg total) by mouth 2 (two) times daily. Patient not taking: Reported on 01/28/2024 10/09/18   Leotis Sole, PA-C    Family History Family History  Problem Relation Age of Onset   Healthy Mother    Healthy Father     Social History Social History   Tobacco Use   Smoking status: Never   Smokeless tobacco: Never  Vaping Use   Vaping status: Never Used  Substance Use Topics   Alcohol use: Yes    Comment: occasional   Drug use: Never      Allergies   Patient has no known allergies.   Review of Systems Review of Systems  Constitutional:  Negative for chills and fever.  Eyes:  Negative for discharge and redness.  Respiratory:  Negative for shortness of breath.   Genitourinary:  Negative for dysuria and penile discharge.  Neurological:  Negative for numbness.     Physical Exam Triage Vital Signs ED Triage Vitals  Encounter Vitals Group     BP      Girls Systolic BP Percentile      Girls Diastolic BP Percentile      Boys Systolic BP Percentile      Boys Diastolic BP Percentile      Pulse      Resp      Temp      Temp src      SpO2      Weight      Height      Head Circumference      Peak Flow      Pain Score      Pain Loc      Pain Education      Exclude from Growth Chart    No data found.  Updated Vital Signs BP 115/75 (BP Location: Right Arm)   Pulse 67   Temp 98 F (36.7 C) (Oral)   Resp 16  SpO2 97%   Visual Acuity Right Eye Distance:   Left Eye Distance:   Bilateral Distance:    Right Eye Near:   Left Eye Near:    Bilateral Near:     Physical Exam Vitals and nursing note reviewed.  Constitutional:      General: He is not in acute distress.    Appearance: Normal appearance. He is not ill-appearing.  HENT:     Head: Normocephalic and atraumatic.  Eyes:     Conjunctiva/sclera: Conjunctivae normal.  Cardiovascular:     Rate and Rhythm: Normal rate.  Pulmonary:     Effort: Pulmonary effort is normal. No respiratory distress.  Neurological:     Mental Status: He is alert.  Psychiatric:        Mood and Affect: Mood normal.        Behavior: Behavior normal.        Thought Content: Thought content normal.      UC Treatments / Results  Labs (all labs ordered are listed, but only abnormal results are displayed) Labs Reviewed  CYTOLOGY, (ORAL, ANAL, URETHRAL) ANCILLARY ONLY    EKG   Radiology No results found.  Procedures Procedures (including critical care  time)  Medications Ordered in UC Medications - No data to display  Initial Impression / Assessment and Plan / UC Course  I have reviewed the triage vital signs and the nursing notes.  Pertinent labs & imaging results that were available during my care of the patient were reviewed by me and considered in my medical decision making (see chart for details).    Unable to verify results further patient provides.  Will review screening for STDs and advised abstinence from sexual activity while awaiting results.   Will treat based on results.  Final Clinical Impressions(s) / UC Diagnoses   Final diagnoses:  Screening for STD (sexually transmitted disease)   Discharge Instructions   None    ED Prescriptions   None    PDMP not reviewed this encounter.   Billy Asberry FALCON, PA-C 01/28/24 [phone]

## 2024-01-28 NOTE — ED Triage Notes (Addendum)
 Requests testing for STI. States he was seen at [facility] a few days ago and was told he had prescriptions sent to pharmacy but never received them. Denies pain/denies discharge/ burning with urination. Has a screenshot of mychart results positive for GC and Chlamydia from 01/18/24 but his name is not shown on the results.

## 2024-01-30 LAB — CYTOLOGY, (ORAL, ANAL, URETHRAL) ANCILLARY ONLY
Chlamydia: NEGATIVE
Comment: NEGATIVE
Comment: NEGATIVE
Comment: NORMAL
Neisseria Gonorrhea: NEGATIVE
Trichomonas: NEGATIVE

## 2024-05-20 ENCOUNTER — Emergency Department (HOSPITAL_COMMUNITY)
Admission: EM | Admit: 2024-05-20 | Discharge: 2024-05-21 | Discharge status: Against Medical Advice | Attending: Emergency Medicine | Admitting: Emergency Medicine

## 2024-05-20 ENCOUNTER — Other Ambulatory Visit: Payer: Self-pay

## 2024-05-20 ENCOUNTER — Emergency Department (HOSPITAL_COMMUNITY)

## 2024-05-20 DIAGNOSIS — M791 Myalgia, unspecified site: Secondary | ICD-10-CM | POA: Diagnosis present

## 2024-05-20 DIAGNOSIS — Z5321 Procedure and treatment not carried out due to patient leaving prior to being seen by health care provider: Secondary | ICD-10-CM | POA: Insufficient documentation

## 2024-05-20 DIAGNOSIS — M79605 Pain in left leg: Secondary | ICD-10-CM | POA: Diagnosis not present

## 2024-05-20 DIAGNOSIS — J101 Influenza due to other identified influenza virus with other respiratory manifestations: Secondary | ICD-10-CM | POA: Insufficient documentation

## 2024-05-20 LAB — CBC WITH DIFFERENTIAL/PLATELET
Abs Immature Granulocytes: 0.01 K/uL (ref 0.00–0.07)
Basophils Absolute: 0 K/uL (ref 0.0–0.1)
Basophils Relative: 0 %
Eosinophils Absolute: 0 K/uL (ref 0.0–0.5)
Eosinophils Relative: 0 %
HCT: 47.8 % (ref 39.0–52.0)
Hemoglobin: 15.7 g/dL (ref 13.0–17.0)
Immature Granulocytes: 0 %
Lymphocytes Relative: 13 %
Lymphs Abs: 0.7 K/uL (ref 0.7–4.0)
MCH: 28.3 pg (ref 26.0–34.0)
MCHC: 32.8 g/dL (ref 30.0–36.0)
MCV: 86.1 fL (ref 80.0–100.0)
Monocytes Absolute: 0.4 K/uL (ref 0.1–1.0)
Monocytes Relative: 7 %
Neutro Abs: 4.2 K/uL (ref 1.7–7.7)
Neutrophils Relative %: 80 %
Platelets: 168 K/uL (ref 150–400)
RBC: 5.55 MIL/uL (ref 4.22–5.81)
RDW: 14.1 % (ref 11.5–15.5)
WBC: 5.2 K/uL (ref 4.0–10.5)
nRBC: 0 % (ref 0.0–0.2)

## 2024-05-20 LAB — COMPREHENSIVE METABOLIC PANEL WITH GFR
ALT: 21 U/L (ref 0–44)
AST: 24 U/L (ref 15–41)
Albumin: 4.4 g/dL (ref 3.5–5.0)
Alkaline Phosphatase: 73 U/L (ref 38–126)
Anion gap: 10 (ref 5–15)
BUN: 13 mg/dL (ref 6–20)
CO2: 25 mmol/L (ref 22–32)
Calcium: 9.2 mg/dL (ref 8.9–10.3)
Chloride: 100 mmol/L (ref 98–111)
Creatinine, Ser: 1.3 mg/dL — ABNORMAL HIGH (ref 0.61–1.24)
GFR, Estimated: >60 mL/min
Glucose, Bld: 90 mg/dL (ref 70–99)
Potassium: 4.1 mmol/L (ref 3.5–5.1)
Sodium: 135 mmol/L (ref 135–145)
Total Bilirubin: 0.5 mg/dL (ref 0.0–1.2)
Total Protein: 8 g/dL (ref 6.5–8.1)

## 2024-05-20 LAB — RESP PANEL BY RT-PCR (RSV, FLU A&B, COVID)  RVPGX2
Influenza A by PCR: POSITIVE — AB
Influenza B by PCR: NEGATIVE
Resp Syncytial Virus by PCR: NEGATIVE
SARS Coronavirus 2 by RT PCR: NEGATIVE

## 2024-05-20 LAB — TROPONIN T, HIGH SENSITIVITY: Troponin T High Sensitivity: <15 ng/L (ref 0–19)

## 2024-05-20 LAB — I-STAT CG4 LACTIC ACID, ED: Lactic Acid, Venous: 0.8 mmol/L (ref 0.5–1.9)

## 2024-05-20 MED ORDER — ACETAMINOPHEN 500 MG PO TABS
1000.0000 mg | ORAL_TABLET | Freq: Once | ORAL | Status: AC
  Administered 2024-05-20: 1000 mg via ORAL
  Filled 2024-05-20: qty 2

## 2024-05-20 NOTE — ED Triage Notes (Signed)
 Pt has flu like symptoms including body aches, headache, fatigue that started two days ago. Pt reports he tried Advil , did not relive any symptoms but made worse. Reports head / leg pain 9/10.

## 2024-05-20 NOTE — ED Triage Notes (Signed)
 Pt has flu like symptoms including body aches, headache, fatigue.

## 2024-05-20 NOTE — ED Notes (Signed)
 Patient transported to X-ray

## 2024-05-20 NOTE — ED Provider Triage Note (Signed)
 Emergency Medicine Provider Triage Evaluation Note  Grant Blair , a 28 y.o. male  was evaluated in triage.  Pt complains of left leg well since yesterday.  Has URI symptoms.  Endorses chest pain..  Review of Systems  Positive: As above Negative: As above  Physical Exam  BP 122/65 (BP Location: Right Arm)   Pulse (!) 109   Temp (!) 104.8 F (40.4 C) (Rectal)   Resp 17   Ht 5' 7 (1.702 m)   Wt 68 kg   SpO2 98%   BMI 23.48 kg/m  Gen:   Awake, no distress   Resp:  Normal effort MSK:   Moves extremities without difficulty  Other:   Medical Decision Making  Medically screening exam initiated at 6:32 PM.  Appropriate orders placed.  EGBERT SEIDEL was informed that the remainder of the evaluation will be completed by another provider, this initial triage assessment does not replace that evaluation, and the importance of remaining in the ED until their evaluation is complete.  Temperature of 104.8 rectally.  Will add blood work.  Otherwise hemodynamically stable.  Blood cultures ordered as well.   Hildegard Loge, PA-C 05/20/24 [phone]

## 2024-05-21 NOTE — ED Notes (Signed)
Called pt for VS no answer

## 2024-05-25 NOTE — ED Notes (Signed)
 Patient had 1/4 BC +gram positive rods, Dr Bernard reports ok to not call patient back for re-eval

## 2024-05-27 LAB — CULTURE, BLOOD (ROUTINE X 2)
Culture  Setup Time: NO GROWTH
Special Requests: ADEQUATE

## 2024-05-28 ENCOUNTER — Telehealth (HOSPITAL_BASED_OUTPATIENT_CLINIC_OR_DEPARTMENT_OTHER): Payer: Self-pay | Admitting: *Deleted

## 2024-05-28 NOTE — Telephone Encounter (Signed)
 Post ED Visit - Positive Culture Follow-up  Culture report reviewed by antimicrobial stewardship pharmacist: [facility] [x]  [place], [place].D. []  Venetia Gully, Pharm.D., BCPS AQ-ID []  Garrel Crews, Pharm.D., BCPS []  Almarie Lunger, Pharm.D., BCPS []  [place], [address].D., BCPS, AAHIVP []  Rosaline Bihari, Pharm.D., BCPS, AAHIVP []  Vernell Meier, PharmD, BCPS []  Latanya Hint, PharmD, BCPS []  Donald Medley, PharmD, BCPS []  Rocky Bold, PharmD []  Dorothyann Alert, PharmD, BCPS []  Morene Babe, PharmD  [facility] []  Rosaline Edison, PharmD []  Romona Bliss, PharmD []  Dolphus Roller, PharmD []  Veva Seip, Rph []  Vernell Daunt) Leonce, PharmD []  Eva Allis, PharmD []  Rosaline Millet, PharmD []  Iantha Batch, PharmD []  Arvin Gauss, PharmD []  Wanda Hasting, PharmD []  Ronal Rav, PharmD []  Rocky Slade, PharmD []  Bard Jeans, PharmD   Positive blood culture Likely Contaminateand no further patient follow-up is required at this time.  Grant Blair 05/28/2024, 1:05 PM
